# Patient Record
Sex: Female | Born: 1975 | Hispanic: Yes | Marital: Single | Smoking: Never smoker
Health system: Southern US, Community
[De-identification: ages and names within clinical notes are randomized; demographics above are authoritative.]

---

## 2008-06-13 ENCOUNTER — Ambulatory Visit: Payer: Self-pay | Admitting: Family Medicine

## 2008-10-07 ENCOUNTER — Inpatient Hospital Stay: Payer: Self-pay | Admitting: Obstetrics and Gynecology

## 2009-09-19 IMAGING — CR DG CHEST 1V
1 series · 1 of 1 positions shown · non-contrast
Comparison: none

REASON FOR EXAM: +ppd
COMMENTS:

PROCEDURE:     DXR - DXR CHEST 1 VIEWAP OR PA  - June 13, 2008  [DATE]
RESULT:     No acute cardiopulmonary disease is identified. No focal
pulmonary nodule is noted.  The cardiovascular structures are unremarkable.

[view not recorded]
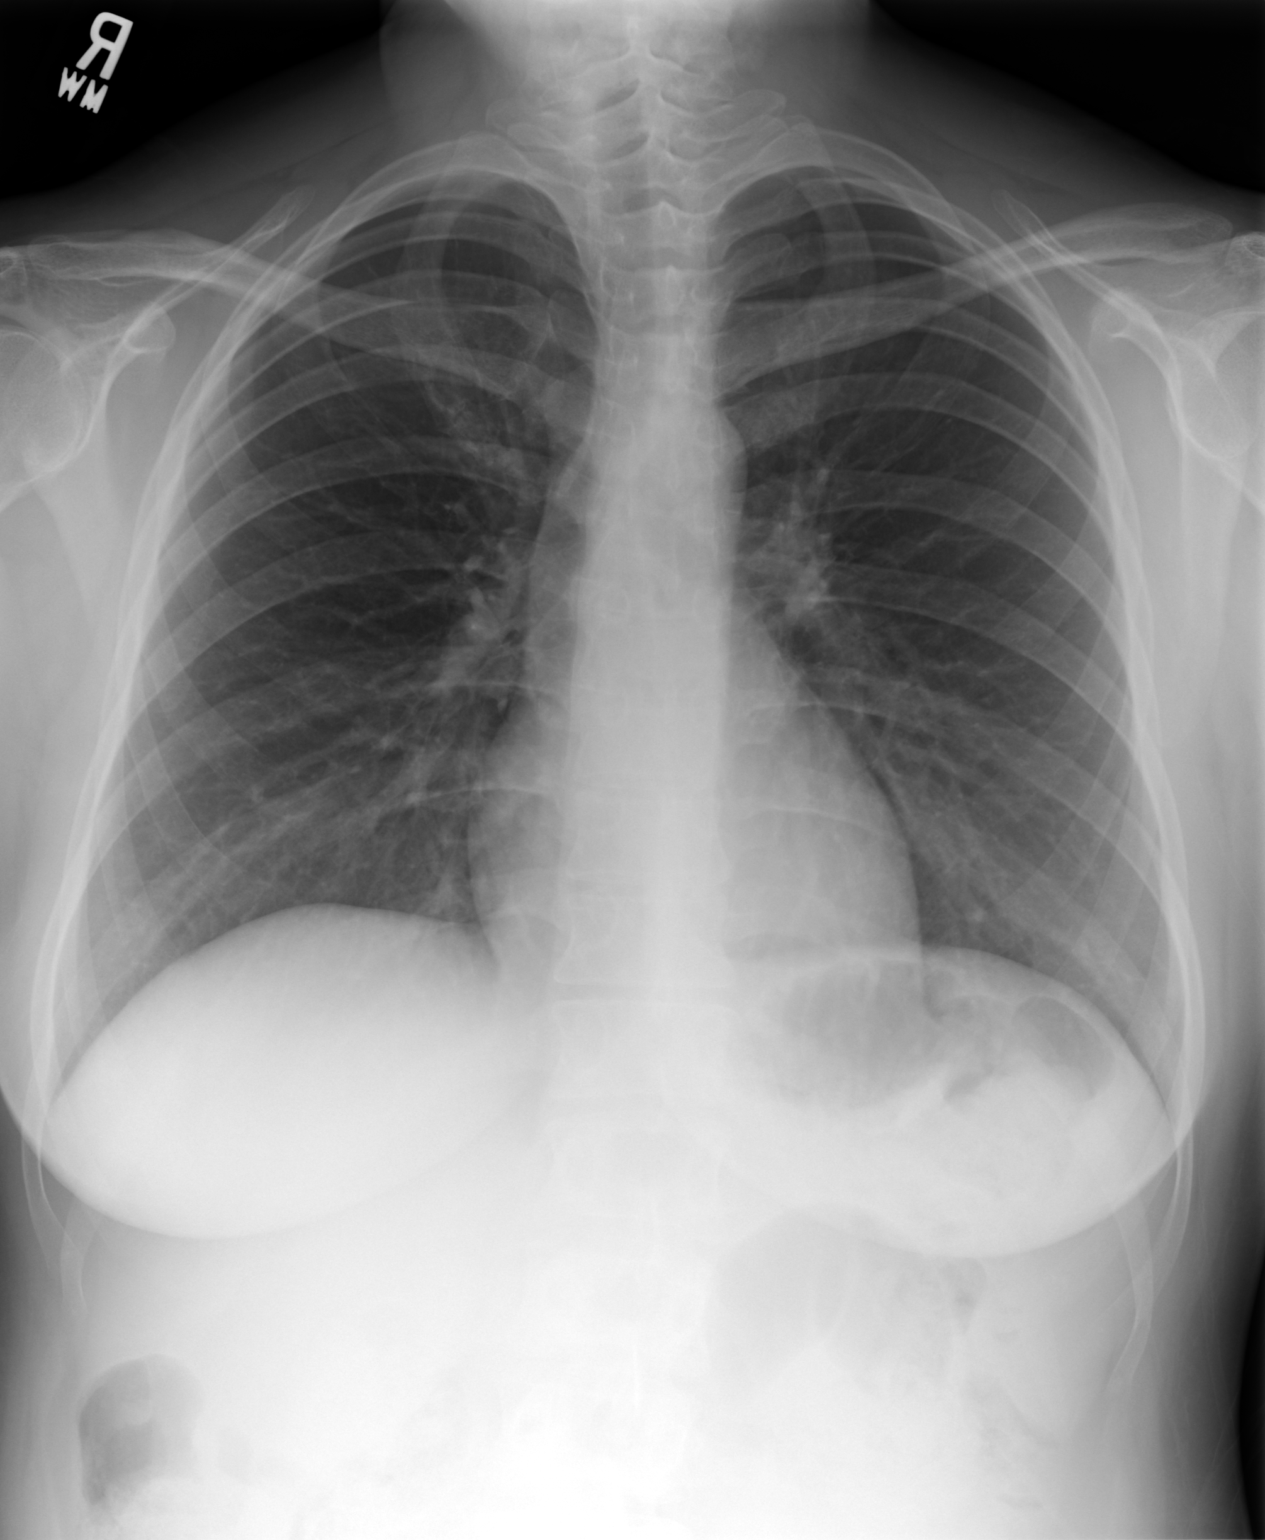

[1 of 1 positions shown; findings below may reference images not displayed]

IMPRESSION: Negative chest.

## 2020-04-12 ENCOUNTER — Other Ambulatory Visit: Payer: Self-pay | Admitting: Family Medicine

## 2020-04-12 DIAGNOSIS — N939 Abnormal uterine and vaginal bleeding, unspecified: Secondary | ICD-10-CM

## 2020-04-18 ENCOUNTER — Ambulatory Visit
Admission: RE | Admit: 2020-04-18 | Discharge: 2020-04-18 | Disposition: A | Discharge status: Home / Self Care | Payer: BC Managed Care – PPO | Source: Ambulatory Visit | Attending: Family Medicine | Admitting: Family Medicine

## 2020-04-18 ENCOUNTER — Other Ambulatory Visit: Payer: Self-pay

## 2020-04-18 DIAGNOSIS — N939 Abnormal uterine and vaginal bleeding, unspecified: Secondary | ICD-10-CM | POA: Insufficient documentation

## 2021-07-25 IMAGING — US US PELVIS COMPLETE WITH TRANSVAGINAL
2 series · 13 of 25 positions shown · non-contrast
Comparison: None

CLINICAL DATA: Abnormal uterine bleeding

EXAM:
TRANSABDOMINAL AND TRANSVAGINAL ULTRASOUND OF PELVIS
TECHNIQUE: Both transabdominal and transvaginal ultrasound examinations of the
pelvis were performed. Transabdominal technique was performed for
global imaging of the pelvis including uterus, ovaries, adnexal
regions, and pelvic cul-de-sac. It was necessary to proceed with
endovaginal exam following the transabdominal exam to visualize the
uterus endometrium ovaries

[Series 1: us pelvic complete with transvaginal · 12 of 162 slices shown]
[im 1/162]
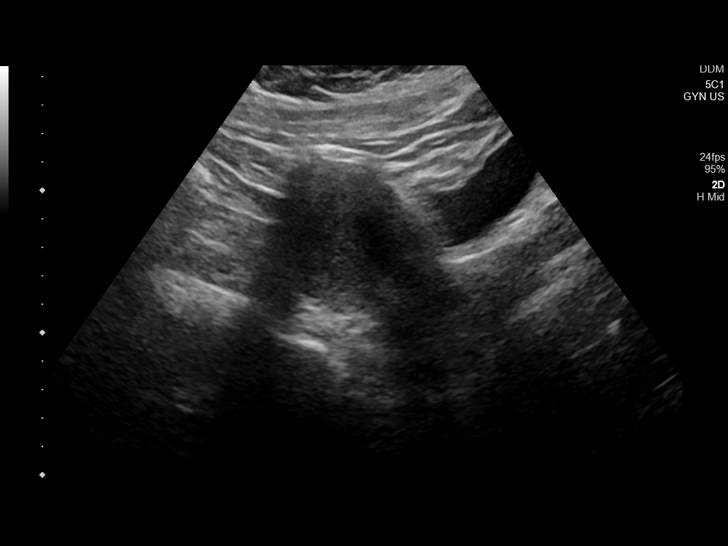
[im 15/162]
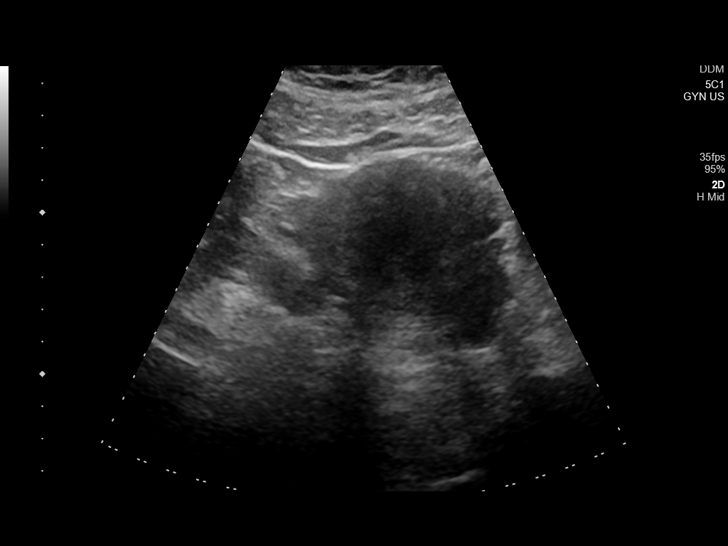
[im 29/162]
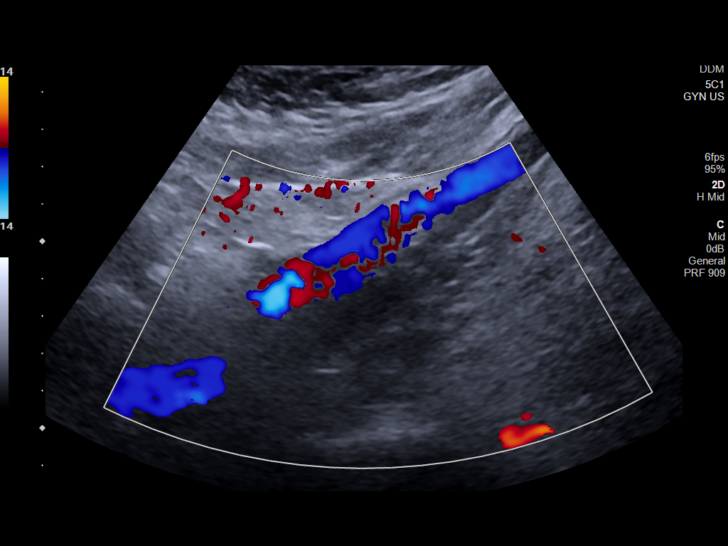
[im 43/162]
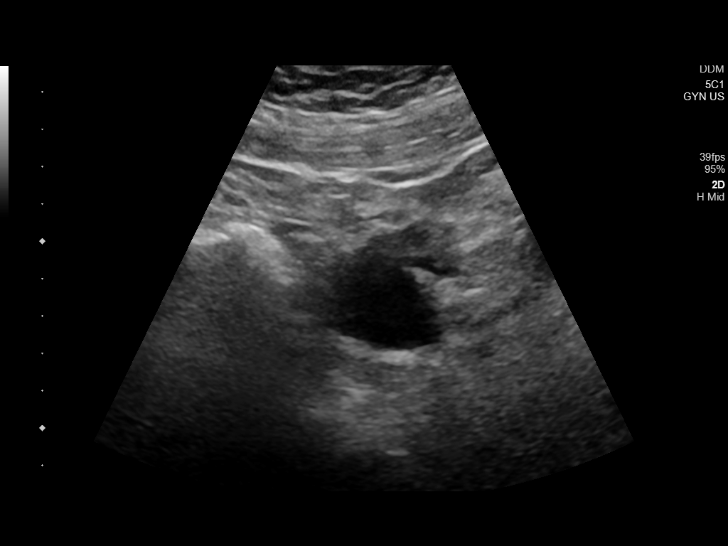
[im 57/162]
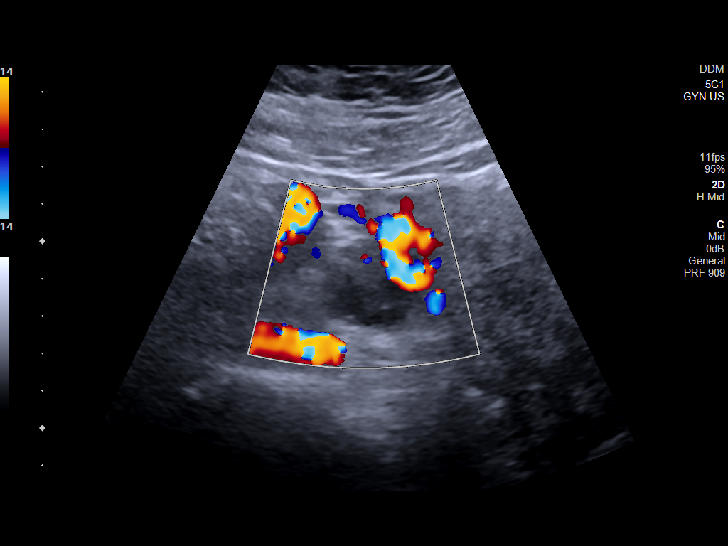
[im 71/162]
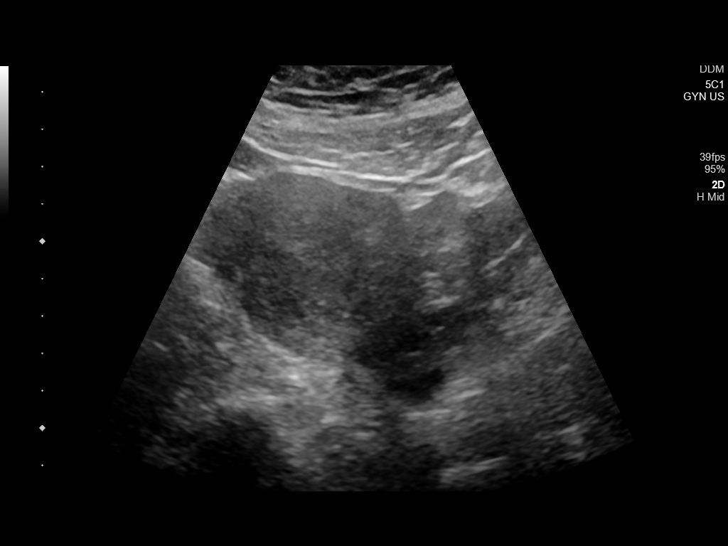
[im 85/162]
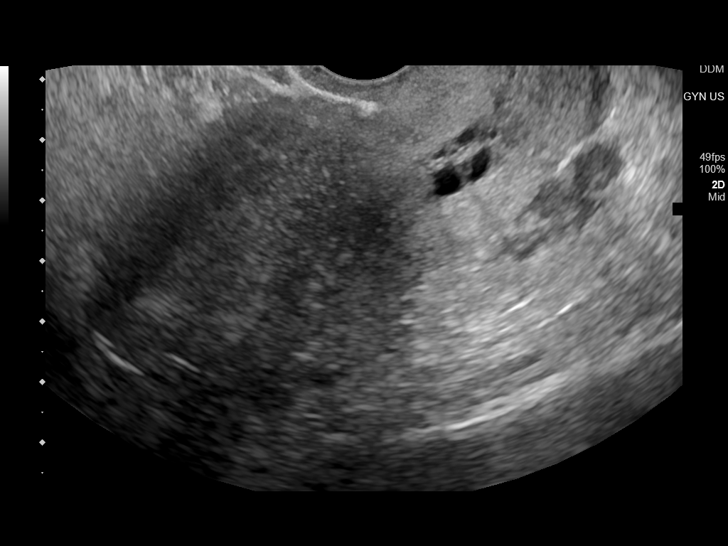
[im 99/162]
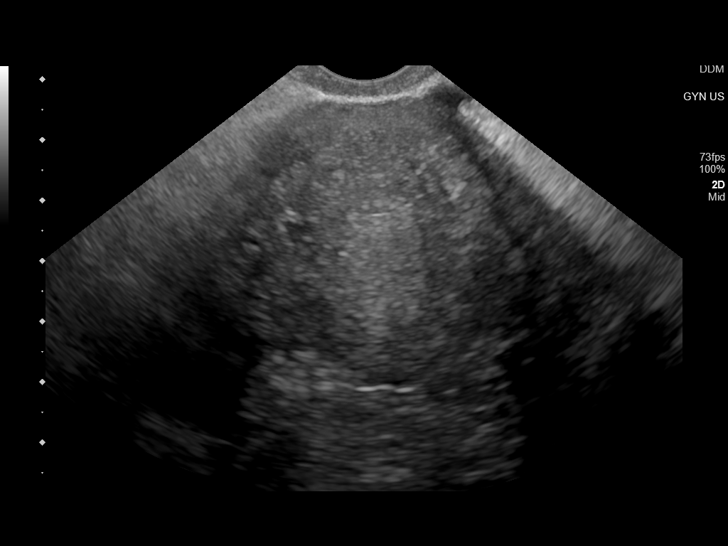
[im 113/162]
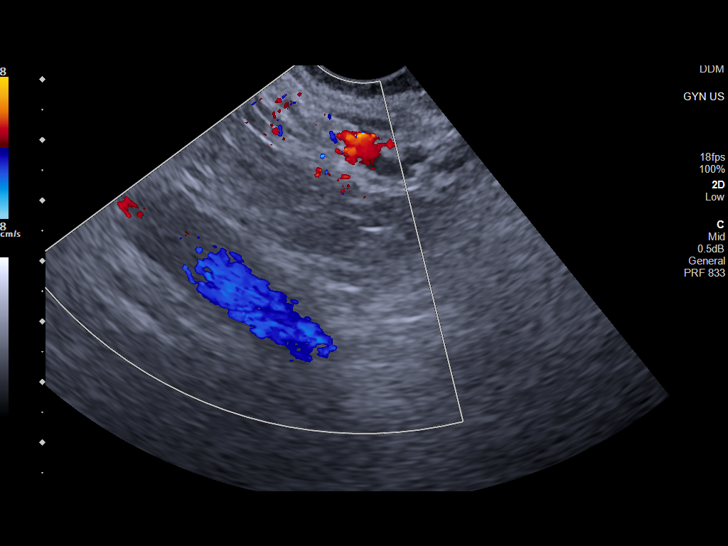
[im 127/162]
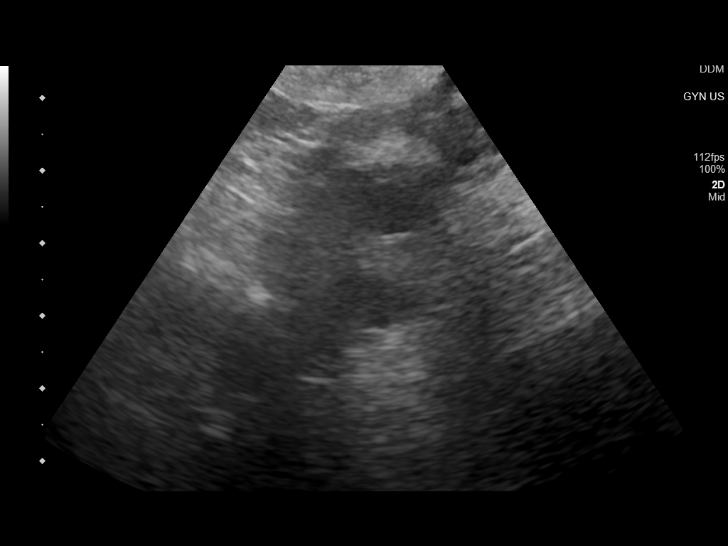
[im 141/162]
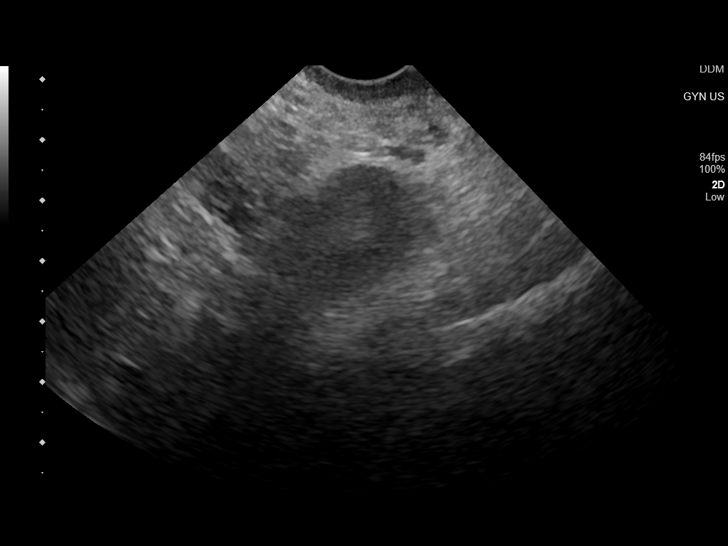
[im 155/162]
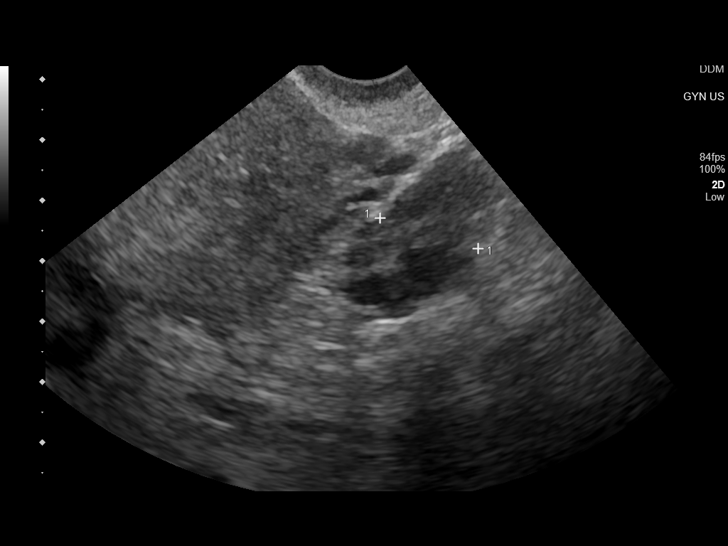

[Series 1001: gyn us · 1 of 1 slices shown]
[im 1/1]
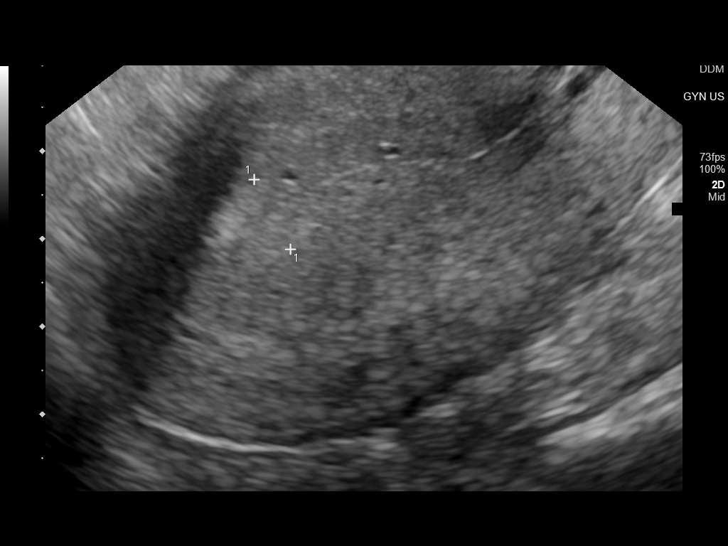

[13 of 25 positions shown; findings below may reference images not displayed]

FINDINGS: Uterus

Measurements: 9.8 x 4.5 x 5.9 cm = volume: 135 mL. No fibroids or
other mass visualized.

Endometrium

Thickness: 9 mm. Indistinct appearance at the uterine fundus.
Multiple tiny sub endometrial cysts.

Right ovary

Measurements: 4.3 x 2.3 x 2.8 cm = volume: 14.6 mL. Normal
appearance/no adnexal mass.

Left ovary

Measurements: 4.2 x 2.5 x 2.6 cm = volume: 14.1 mL. Normal
appearance/no adnexal mass.

Other findings

No abnormal free fluid.
IMPRESSION: Indistinct appearance of the endometrial stripe at the uterine
fundus with multiple tiny myometrial cysts, consider adenomyosis.

Otherwise negative pelvic ultrasound.

## 2021-09-25 ENCOUNTER — Other Ambulatory Visit: Payer: Self-pay | Admitting: Physician Assistant

## 2022-12-12 ENCOUNTER — Encounter: Payer: Self-pay | Admitting: Family Medicine

## 2022-12-23 ENCOUNTER — Other Ambulatory Visit: Payer: Self-pay

## 2022-12-23 DIAGNOSIS — Z1231 Encounter for screening mammogram for malignant neoplasm of breast: Secondary | ICD-10-CM

## 2023-01-12 ENCOUNTER — Ambulatory Visit
Admission: RE | Admit: 2023-01-12 | Discharge: 2023-01-12 | Disposition: A | Discharge status: Home / Self Care | Payer: Self-pay | Source: Ambulatory Visit | Attending: Obstetrics and Gynecology | Admitting: Obstetrics and Gynecology

## 2023-01-12 ENCOUNTER — Ambulatory Visit: Payer: Self-pay | Attending: Hematology and Oncology | Admitting: Hematology and Oncology

## 2023-01-12 VITALS — BP 123/67 | Wt 218.5 lb

## 2023-01-12 DIAGNOSIS — Z1231 Encounter for screening mammogram for malignant neoplasm of breast: Secondary | ICD-10-CM

## 2023-01-12 NOTE — Patient Instructions (Signed)
Taught Audrey Davidson about self breast awareness and gave educational materials to take home. Patient did not need a Pap smear today due to last Pap smear was in 2021 per patient. Let her know [facility] will cover Pap smears every 5 years unless has a history of abnormal Pap smears. [facility]. Appointment scheduled for 01/12/23. Patient aware of appointment and will be there. Let patient know will follow up with her within the next couple weeks with results. Audrey Davidson verbalized understanding.  Melodye Ped, NP 2:22 PM

## 2023-01-12 NOTE — Progress Notes (Signed)
Ms. Audrey Davidson is a 47 y.o. female who presents to [facility] clinic today with no complaints.    Pap Smear: Pap not smear completed today. Last Pap smear was 2021 at [facility] clinic and was normal. Per patient has no history of an abnormal Pap smear. Last Pap smear result is available in Epic.   Physical exam: Breasts Breasts symmetrical. No skin abnormalities bilateral breasts. No nipple retraction bilateral breasts. No nipple discharge bilateral breasts. No lymphadenopathy. No lumps palpated bilateral breasts.       Pelvic/Bimanual Pap is not indicated today    Smoking History: Patient has never smoked and was not referred to quit line.    Patient Navigation: Patient education provided. Access to services provided for patient through [place] interpreter provided. No transportation provided   Colorectal Cancer Screening: Per patient has never had colonoscopy completed No complaints today. FIT test negative per Audrey Davidson.   Breast and Cervical Cancer Risk Assessment: Patient does not have family history of breast cancer, known genetic mutations, or radiation treatment to the chest before age 70. Patient does not have history of cervical dysplasia, immunocompromised, or DES exposure in-utero.  Risk Assessment   No risk assessment data     A: [facility] exam without pap smear No complaints with benign exam.   P: [facility] for a [facility]. Appointment scheduled 01/12/23.  Audrey Scrape, FNP- [facility] 01/12/23

## 2023-01-14 ENCOUNTER — Other Ambulatory Visit: Payer: Self-pay | Admitting: Obstetrics and Gynecology

## 2023-01-14 DIAGNOSIS — N63 Unspecified lump in unspecified breast: Secondary | ICD-10-CM

## 2023-01-14 DIAGNOSIS — N6489 Other specified disorders of breast: Secondary | ICD-10-CM

## 2023-01-14 DIAGNOSIS — R928 Other abnormal and inconclusive findings on diagnostic imaging of breast: Secondary | ICD-10-CM

## 2023-01-15 ENCOUNTER — Ambulatory Visit
Admission: RE | Admit: 2023-01-15 | Discharge: 2023-01-15 | Disposition: A | Discharge status: Home / Self Care | Payer: Self-pay | Source: Ambulatory Visit | Attending: Obstetrics and Gynecology | Admitting: Obstetrics and Gynecology

## 2023-01-15 DIAGNOSIS — R928 Other abnormal and inconclusive findings on diagnostic imaging of breast: Secondary | ICD-10-CM | POA: Insufficient documentation

## 2023-01-15 DIAGNOSIS — N6489 Other specified disorders of breast: Secondary | ICD-10-CM | POA: Insufficient documentation

## 2023-01-15 DIAGNOSIS — N63 Unspecified lump in unspecified breast: Secondary | ICD-10-CM

## 2023-08-17 ENCOUNTER — Ambulatory Visit: Payer: Self-pay

## 2023-08-17 ENCOUNTER — Ambulatory Visit
Admission: EM | Admit: 2023-08-17 | Discharge: 2023-08-17 | Disposition: A | Discharge status: Home / Self Care | Payer: Self-pay | Attending: Physician Assistant | Admitting: Physician Assistant

## 2023-08-17 DIAGNOSIS — R051 Acute cough: Secondary | ICD-10-CM

## 2023-08-17 DIAGNOSIS — J209 Acute bronchitis, unspecified: Secondary | ICD-10-CM

## 2023-08-17 MED ORDER — PREDNISONE 20 MG PO TABS: 40.0000 mg | ORAL_TABLET | Freq: Every day | ORAL | 0 refills | Status: AC

## 2023-08-17 MED ORDER — AZITHROMYCIN 250 MG PO TABS: 250.0000 mg | ORAL_TABLET | Freq: Every day | ORAL | 0 refills | Status: AC

## 2023-08-17 MED ORDER — PROMETHAZINE-DM 6.25-15 MG/5ML PO SYRP
5.0000 mL | ORAL_SOLUTION | Freq: Four times a day (QID) | ORAL | 0 refills | Status: AC | PRN
Start: 2023-08-17 — End: ?

## 2023-08-17 NOTE — ED Provider Notes (Signed)
[facility]    CSN: [id number] Arrival date & time: 08/17/23  1708      History   Chief Complaint Chief Complaint  Patient presents with   Cough    HPI Audrey Davidson is a 47 y.o. female presenting for approximately 2-week history of productive cough, congestion, fatigue, sore throat.  Patient has not had any recorded fevers but has felt feverish at times.  She has had a few episodes of blood-tinged sputum.  Denies chest pain or shortness of breath.  Symptoms have not gotten any better or worse from onset.  No sick contacts.  Has been taking over-the-counter medicine without relief.  No history of cardiopulmonary problems.  No other complaints.  Patient's son and daughter are present and helping to translate.  HPI  History reviewed. No pertinent past medical history.  There are no problems to display for this patient.   History reviewed. No pertinent surgical history.  OB History   No obstetric history on file.      Home Medications    Prior to Admission medications   Medication Sig Start Date End Date Taking? Authorizing Provider  azithromycin (ZITHROMAX) 250 MG tablet Take 1 tablet (250 mg total) by mouth daily. Take first 2 tablets together, then 1 every day until finished. 08/17/23  Yes Shirlee Latch, PA-C  predniSONE (DELTASONE) 20 MG tablet Take 2 tablets (40 mg total) by mouth daily for 5 days. 08/17/23 08/22/23 Yes Shirlee Latch, PA-C  promethazine-dextromethorphan (PROMETHAZINE-DM) 6.25-15 MG/5ML syrup Take 5 mLs by mouth 4 (four) times daily as needed. 08/17/23  Yes Shirlee Latch, PA-C    Family History Family History  Problem Relation Age of Onset   Diabetes Mother    Diabetes Sister    Diabetes Brother     Social History Social History   Tobacco Use   Smoking status: Never   Smokeless tobacco: Never  Vaping Use   Vaping status: Never Used  Substance Use Topics   Alcohol use: Not Currently   Drug use: Never     Allergies    Patient has no known allergies.   Review of Systems Review of Systems  Constitutional:  Positive for fatigue. Negative for chills, diaphoresis and fever.  HENT:  Positive for congestion and sore throat. Negative for ear pain, rhinorrhea, sinus pressure and sinus pain.   Respiratory:  Positive for cough. Negative for shortness of breath.   Cardiovascular:  Negative for chest pain.  Gastrointestinal:  Negative for abdominal pain, nausea and vomiting.  Musculoskeletal:  Negative for arthralgias and myalgias.  Skin:  Negative for rash.  Neurological:  Negative for weakness and headaches.  Hematological:  Negative for adenopathy.     Physical Exam Triage Vital Signs ED Triage Vitals  Encounter Vitals Group     BP 08/17/23 1802 129/82     Systolic BP Percentile --      Diastolic BP Percentile --      Pulse Rate 08/17/23 1802 78     Resp --      Temp 08/17/23 1802 98.6 F (37 C)     Temp Source 08/17/23 1802 Oral     SpO2 08/17/23 1802 97 %     Weight 08/17/23 1800 210 lb (95.3 kg)     Height --      Head Circumference --      Peak Flow --      Pain Score 08/17/23 1800 8     Pain Loc --  Pain Education --      Exclude from Growth Chart --    No data found.  Updated Vital Signs BP 129/82 (BP Location: Right Arm)   Pulse 78   Temp 98.6 F (37 C) (Oral)   Wt 210 lb (95.3 kg)   LMP 07/31/2023 (Exact Date)   SpO2 97%     Physical Exam Vitals and nursing note reviewed.  Constitutional:      General: She is not in acute distress.    Appearance: Normal appearance. She is not ill-appearing or toxic-appearing.  HENT:     Head: Normocephalic and atraumatic.     Nose: Nose normal.     Mouth/Throat:     Mouth: Mucous membranes are moist.     Pharynx: Oropharynx is clear. Posterior oropharyngeal erythema present.  Eyes:     General: No scleral icterus.       Right eye: No discharge.        Left eye: No discharge.     Conjunctiva/sclera: Conjunctivae normal.   Cardiovascular:     Rate and Rhythm: Normal rate and regular rhythm.     Heart sounds: Normal heart sounds.  Pulmonary:     Effort: Pulmonary effort is normal. No respiratory distress.     Breath sounds: Rhonchi present.  Musculoskeletal:     Cervical back: Neck supple.  Skin:    General: Skin is dry.  Neurological:     General: No focal deficit present.     Mental Status: She is alert. Mental status is at baseline.     Motor: No weakness.     Gait: Gait normal.  Psychiatric:        Mood and Affect: Mood normal.      UC Treatments / Results  Labs (all labs ordered are listed, but only abnormal results are displayed) Labs Reviewed - No data to display  EKG   Radiology No results found.  Procedures Procedures (including critical care time)  Medications Ordered in UC Medications - No data to display  Initial Impression / Assessment and Plan / UC Course  I have reviewed the triage vital signs and the nursing notes.  Pertinent labs & imaging results that were available during my care of the patient were reviewed by me and considered in my medical decision making (see chart for details).   47 year old female presents for occasionally productive cough, sore throat, fatigue x 2 weeks.  No associated recorded fever, chest pain or shortness of breath.  Vitals are stable.  Patient overall well-appearing.  Mild posterior pharyngeal erythema.  Few scattered rhonchi which are mostly clear with cough.  Chest x-ray performed without evidence of pneumonia on wet read.  Acute bronchitis.  Sent azithromycin to pharmacy as well as prednisone and Promethazine DM.  Encouraged increased rest and fluids.  Will call and add Augmentin if pneumonia is seen by radiologist on x-ray.  Otherwise advised supportive care and reviewed return precautions.  Work note given.  CXR normal.   Final Clinical Impressions(s) / UC Diagnoses   Final diagnoses:  Acute bronchitis, unspecified organism   Acute cough     Discharge Instructions      -Tienes bronquitis. Envi un corticoide a la farmacia (empieza por la maana). Tambin te envi medicamentos para la tos y antibiticos que puedes tomar esta noche. -Aumentar el descanso y los lquidos. -Llamar si el radilogo ve neumona en la radiografa pero yo no lo hice. -Regresar si empeora la tos o la dificultad para Industrial/product designer.  -  You have bronchitis. I sent a corticosteroid to pharmacy (start in the morning). I also sent cough medicine and antibiotics you can take tonight. -Increase rest and fluids. -Will call if the radiologist sees pneumonia on the x-ray but I did not. -Return if worsening cough or breathing difficulty.      ED Prescriptions     Medication Sig Dispense Auth. Provider   azithromycin (ZITHROMAX) 250 MG tablet Take 1 tablet (250 mg total) by mouth daily. Take first 2 tablets together, then 1 every day until finished. 6 tablet Eusebio Friendly B, PA-C   predniSONE (DELTASONE) 20 MG tablet Take 2 tablets (40 mg total) by mouth daily for 5 days. 10 tablet Shirlee Latch, PA-C   promethazine-dextromethorphan (PROMETHAZINE-DM) 6.25-15 MG/5ML syrup Take 5 mLs by mouth 4 (four) times daily as needed. 118 mL Shirlee Latch, PA-C      PDMP not reviewed this encounter.   Shirlee Latch, PA-C 08/18/23 [phone]

## 2023-08-17 NOTE — ED Triage Notes (Signed)
Pt presents to UC c/o cough onset x15 days ago, pt states at times she does have bloody phlegm, pt also reports sore inside of mouth x1 week.

## 2023-08-17 NOTE — Discharge Instructions (Signed)
-  Tienes bronquitis. Envi un corticoide a la farmacia (empieza por la maana). Tambin te envi medicamentos para la tos y antibiticos que puedes tomar esta noche. -Aumentar el descanso y los lquidos. -Llamar si el radilogo ve neumona en la radiografa pero yo no lo hice. -Regresar si empeora la tos o la dificultad para respirar.  -You have bronchitis. I sent a corticosteroid to pharmacy (start in the morning). I also sent cough medicine and antibiotics you can take tonight. -Increase rest and fluids. -Will call if the radiologist sees pneumonia on the x-ray but I did not. -Return if worsening cough or breathing difficulty.

## 2023-11-18 ENCOUNTER — Other Ambulatory Visit: Payer: Self-pay

## 2023-11-18 DIAGNOSIS — N6321 Unspecified lump in the left breast, upper outer quadrant: Secondary | ICD-10-CM

## 2024-01-04 ENCOUNTER — Ambulatory Visit: Payer: Self-pay | Attending: Hematology and Oncology | Admitting: Hematology and Oncology

## 2024-01-04 ENCOUNTER — Ambulatory Visit
Admission: RE | Admit: 2024-01-04 | Discharge: 2024-01-04 | Disposition: A | Discharge status: Home / Self Care | Payer: Self-pay | Source: Ambulatory Visit | Attending: Obstetrics and Gynecology | Admitting: Obstetrics and Gynecology

## 2024-01-04 VITALS — BP 133/79 | Ht 67.0 in | Wt 226.5 lb

## 2024-01-04 DIAGNOSIS — N6321 Unspecified lump in the left breast, upper outer quadrant: Secondary | ICD-10-CM

## 2024-01-04 NOTE — Progress Notes (Signed)
 Ms. Malayiah Mcbrayer is a 48 y.o. female who presents to [facility] clinic today with no complaints.    Pap Smear: Pap not smear completed today. Last Pap smear was 04/03/2020 at [facility] clinic and was normal. Per patient has no history of an abnormal Pap smear. Last Pap smear result is available in Epic.   Physical exam: Breasts Breasts symmetrical. No skin abnormalities bilateral breasts. No nipple retraction bilateral breasts. No nipple discharge bilateral breasts. No lymphadenopathy. No lumps palpated right breast. Approximately 3 cm mobile, flat mass noted at 2 o'clock, 1 cm from the nipple.    Pelvic/Bimanual Pap is not indicated today    Smoking History: Patient has never smoked and was not referred to quit line.    Patient Navigation: Patient education provided. Access to services provided for patient through [facility] program. [place] interpreter provided. No transportation provided   Colorectal Cancer Screening: Per patient has never had colonoscopy completed No complaints today. Will complete at [facility] at PCP appointment.   Breast and Cervical Cancer Risk Assessment: Patient does not have family history of breast cancer, known genetic mutations, or radiation treatment to the chest before age 60. Patient does not have history of cervical dysplasia, immunocompromised, or DES exposure in-utero.  Risk Scores as of Encounter on 01/04/2024     Dondra Spry           5-year 0.67%   Lifetime 7.9%   This patient is [place] but has no documented birth country, so the [employer] model used data from [place] patients to calculate their risk score. Document a birth country in the Demographics activity for a more accurate score.         Last calculated by Narda Rutherford, LPN on 6/96/2952 at  9:19 AM        A: [facility] exam without pap smear Complaint of left breast mass. Will send for diagnostic imaging.   P: Referred patient to the [facility] for a diagnostic  mammogram. Appointment scheduled 01/04/2024.  Pascal Lux, NP 01/04/2024 9:31 AM

## 2024-01-04 NOTE — Patient Instructions (Signed)
 Taught Audrey Davidson about self breast awareness and gave educational materials to take home. Patient did not need a Pap smear today due to last Pap smear was in 04/03/2020 per patient.  Let her know [facility] will cover Pap smears every 5 years unless has a history of abnormal Pap smears. Referred patient to the [facility] for diagnostic mammogram. Appointment scheduled for 01/04/2024. Patient aware of appointment and will be there. Let patient know will follow up with her within the next couple weeks with results. Audrey Davidson verbalized understanding.  Pascal Lux, NP 9:36 AM

## 2024-01-05 ENCOUNTER — Other Ambulatory Visit: Payer: Self-pay | Admitting: Obstetrics and Gynecology

## 2024-01-05 DIAGNOSIS — R928 Other abnormal and inconclusive findings on diagnostic imaging of breast: Secondary | ICD-10-CM

## 2024-01-14 ENCOUNTER — Ambulatory Visit
Admission: RE | Admit: 2024-01-14 | Discharge: 2024-01-14 | Disposition: A | Discharge status: Home / Self Care | Payer: Self-pay | Source: Ambulatory Visit | Attending: Obstetrics and Gynecology | Admitting: Obstetrics and Gynecology

## 2024-01-14 DIAGNOSIS — R928 Other abnormal and inconclusive findings on diagnostic imaging of breast: Secondary | ICD-10-CM | POA: Insufficient documentation

## 2024-01-14 MED ORDER — LIDOCAINE 1 % OPTIME INJ - NO CHARGE
5.0000 mL | Freq: Once | INTRAMUSCULAR | Status: AC
  Administered 2024-01-14: 5 mL via INTRADERMAL
  Filled 2024-01-14: qty 6
